# Patient Record
Sex: Male | Born: 1995 | Race: White | Hispanic: No | Marital: Single | Smoking: Current every day smoker
Health system: Southern US, Community
[De-identification: ages and names within clinical notes are randomized; demographics above are authoritative.]

## PROBLEM LIST (undated history)

## (undated) DIAGNOSIS — I319 Disease of pericardium, unspecified: Secondary | ICD-10-CM

## (undated) DIAGNOSIS — I514 Myocarditis, unspecified: Secondary | ICD-10-CM

---

## 2017-02-19 ENCOUNTER — Encounter (HOSPITAL_COMMUNITY): Payer: Self-pay

## 2017-02-19 ENCOUNTER — Other Ambulatory Visit: Payer: Self-pay

## 2017-02-19 ENCOUNTER — Emergency Department (HOSPITAL_COMMUNITY): Payer: BLUE CROSS/BLUE SHIELD

## 2017-02-19 ENCOUNTER — Inpatient Hospital Stay (HOSPITAL_COMMUNITY)
Admission: EM | Disposition: A | Discharge status: Home / Self Care | Payer: Self-pay | Source: Home / Self Care | Attending: Internal Medicine

## 2017-02-19 ENCOUNTER — Emergency Department (HOSPITAL_BASED_OUTPATIENT_CLINIC_OR_DEPARTMENT_OTHER): Payer: BLUE CROSS/BLUE SHIELD

## 2017-02-19 ENCOUNTER — Inpatient Hospital Stay (HOSPITAL_COMMUNITY)
Admission: EM | Admit: 2017-02-19 | Discharge: 2017-02-21 | DRG: 287 | Disposition: A | Discharge status: Home / Self Care | Payer: BLUE CROSS/BLUE SHIELD | Attending: Internal Medicine | Admitting: Internal Medicine

## 2017-02-19 DIAGNOSIS — R001 Bradycardia, unspecified: Secondary | ICD-10-CM | POA: Diagnosis not present

## 2017-02-19 DIAGNOSIS — I514 Myocarditis, unspecified: Secondary | ICD-10-CM | POA: Insufficient documentation

## 2017-02-19 DIAGNOSIS — I340 Nonrheumatic mitral (valve) insufficiency: Secondary | ICD-10-CM

## 2017-02-19 DIAGNOSIS — R739 Hyperglycemia, unspecified: Secondary | ICD-10-CM | POA: Diagnosis present

## 2017-02-19 DIAGNOSIS — R748 Abnormal levels of other serum enzymes: Secondary | ICD-10-CM

## 2017-02-19 DIAGNOSIS — R072 Precordial pain: Secondary | ICD-10-CM

## 2017-02-19 DIAGNOSIS — R7989 Other specified abnormal findings of blood chemistry: Secondary | ICD-10-CM

## 2017-02-19 DIAGNOSIS — I409 Acute myocarditis, unspecified: Secondary | ICD-10-CM | POA: Diagnosis not present

## 2017-02-19 DIAGNOSIS — I319 Disease of pericardium, unspecified: Secondary | ICD-10-CM

## 2017-02-19 DIAGNOSIS — R778 Other specified abnormalities of plasma proteins: Secondary | ICD-10-CM

## 2017-02-19 DIAGNOSIS — Z823 Family history of stroke: Secondary | ICD-10-CM

## 2017-02-19 DIAGNOSIS — F17210 Nicotine dependence, cigarettes, uncomplicated: Secondary | ICD-10-CM | POA: Diagnosis present

## 2017-02-19 DIAGNOSIS — Z8249 Family history of ischemic heart disease and other diseases of the circulatory system: Secondary | ICD-10-CM

## 2017-02-19 HISTORY — PX: LEFT HEART CATH AND CORONARY ANGIOGRAPHY: CATH118249

## 2017-02-19 HISTORY — DX: Disease of pericardium, unspecified: I31.9

## 2017-02-19 HISTORY — DX: Myocarditis, unspecified: I51.4

## 2017-02-19 LAB — HEPATIC FUNCTION PANEL
ALBUMIN: 4.1 g/dL (ref 3.5–5.0)
ALK PHOS: 60 U/L (ref 38–126)
ALT: 37 U/L (ref 17–63)
AST: 34 U/L (ref 15–41)
BILIRUBIN TOTAL: 1.1 mg/dL (ref 0.3–1.2)
Bilirubin, Direct: <0.1 mg/dL — ABNORMAL LOW (ref 0.1–0.5)
Total Protein: 7.5 g/dL (ref 6.5–8.1)

## 2017-02-19 LAB — BASIC METABOLIC PANEL
Anion gap: 7 (ref 5–15)
BUN: 12 mg/dL (ref 6–20)
CALCIUM: 9.2 mg/dL (ref 8.9–10.3)
CO2: 23 mmol/L (ref 22–32)
CREATININE: 0.86 mg/dL (ref 0.61–1.24)
Chloride: 104 mmol/L (ref 101–111)
GFR calc Af Amer: >60 mL/min (ref >60)
GLUCOSE: 124 mg/dL — AB (ref 65–99)
Potassium: 3.9 mmol/L (ref 3.5–5.1)
Sodium: 134 mmol/L — ABNORMAL LOW (ref 135–145)

## 2017-02-19 LAB — CBC
HCT: 46.3 % (ref 39.0–52.0)
Hemoglobin: 16.4 g/dL (ref 13.0–17.0)
MCH: 31.1 pg (ref 26.0–34.0)
MCHC: 35.4 g/dL (ref 30.0–36.0)
MCV: 87.7 fL (ref 78.0–100.0)
Platelets: 280 10*3/uL (ref 150–400)
RBC: 5.28 MIL/uL (ref 4.22–5.81)
RDW: 13.5 % (ref 11.5–15.5)
WBC: 15 10*3/uL — ABNORMAL HIGH (ref 4.0–10.5)

## 2017-02-19 LAB — PROTIME-INR
INR: 1.05
PROTHROMBIN TIME: 13.6 s (ref 11.4–15.2)

## 2017-02-19 LAB — LIPID PANEL
CHOLESTEROL: 150 mg/dL (ref 0–200)
HDL: 36 mg/dL — ABNORMAL LOW (ref >40)
LDL Cholesterol: 94 mg/dL (ref 0–99)
Total CHOL/HDL Ratio: 4.2 RATIO
Triglycerides: 100 mg/dL (ref <150)
VLDL: 20 mg/dL (ref 0–40)

## 2017-02-19 LAB — I-STAT TROPONIN, ED: TROPONIN I, POC: 2.01 ng/mL — AB (ref 0.00–0.08)

## 2017-02-19 LAB — C-REACTIVE PROTEIN: CRP: 5.1 mg/dL — ABNORMAL HIGH (ref <1.0)

## 2017-02-19 LAB — SEDIMENTATION RATE: SED RATE: 8 mm/h (ref 0–16)

## 2017-02-19 LAB — ECHOCARDIOGRAM COMPLETE

## 2017-02-19 LAB — TROPONIN I
TROPONIN I: 9.73 ng/mL — AB (ref <0.03)
Troponin I: 2.38 ng/mL (ref <0.03)

## 2017-02-19 LAB — HEMOGLOBIN A1C
Hgb A1c MFr Bld: 4.9 % (ref 4.8–5.6)
Mean Plasma Glucose: 93.93 mg/dL

## 2017-02-19 LAB — TSH: TSH: 1.445 u[IU]/mL (ref 0.350–4.500)

## 2017-02-19 SURGERY — LEFT HEART CATH AND CORONARY ANGIOGRAPHY
Anesthesia: LOCAL

## 2017-02-19 MED ORDER — KETOROLAC TROMETHAMINE 30 MG/ML IJ SOLN
30.0000 mg | Freq: Once | INTRAMUSCULAR | Status: AC
  Administered 2017-02-19: 30 mg via INTRAVENOUS
  Filled 2017-02-19: qty 1

## 2017-02-19 MED ORDER — COLCHICINE 0.6 MG PO TABS
0.6000 mg | ORAL_TABLET | Freq: Two times a day (BID) | ORAL | Status: DC
  Administered 2017-02-19 – 2017-02-21 (×5): 0.6 mg via ORAL
  Filled 2017-02-19 (×5): qty 1

## 2017-02-19 MED ORDER — SODIUM CHLORIDE 0.9 % IV SOLN
INTRAVENOUS | Status: DC
  Administered 2017-02-19: 150 mL via INTRAVENOUS
  Administered 2017-02-19: 09:00:00 via INTRAVENOUS

## 2017-02-19 MED ORDER — VERAPAMIL HCL 2.5 MG/ML IV SOLN
INTRAVENOUS | Status: DC | PRN
  Administered 2017-02-19: 10 mL via INTRA_ARTERIAL

## 2017-02-19 MED ORDER — FENTANYL CITRATE (PF) 100 MCG/2ML IJ SOLN
INTRAMUSCULAR | Status: DC | PRN
  Administered 2017-02-19 (×2): 25 ug via INTRAVENOUS

## 2017-02-19 MED ORDER — ONDANSETRON HCL 4 MG/2ML IJ SOLN: 4.0000 mg | Freq: Four times a day (QID) | INTRAMUSCULAR | Status: DC | PRN

## 2017-02-19 MED ORDER — HEPARIN (PORCINE) IN NACL 2-0.9 UNIT/ML-% IJ SOLN
INTRAMUSCULAR | Status: AC
  Filled 2017-02-19: qty 1000

## 2017-02-19 MED ORDER — IBUPROFEN 600 MG PO TABS
600.0000 mg | ORAL_TABLET | Freq: Four times a day (QID) | ORAL | Status: DC
  Administered 2017-02-19 – 2017-02-20 (×3): 600 mg via ORAL
  Filled 2017-02-19 (×3): qty 1

## 2017-02-19 MED ORDER — VERAPAMIL HCL 2.5 MG/ML IV SOLN
INTRAVENOUS | Status: AC
  Filled 2017-02-19: qty 2

## 2017-02-19 MED ORDER — LIDOCAINE HCL (PF) 1 % IJ SOLN
INTRAMUSCULAR | Status: AC
  Filled 2017-02-19: qty 30

## 2017-02-19 MED ORDER — SODIUM CHLORIDE 0.9% FLUSH: 3.0000 mL | INTRAVENOUS | Status: DC | PRN

## 2017-02-19 MED ORDER — FENTANYL CITRATE (PF) 100 MCG/2ML IJ SOLN
INTRAMUSCULAR | Status: AC
  Filled 2017-02-19: qty 2

## 2017-02-19 MED ORDER — SODIUM CHLORIDE 0.9 % IV SOLN: 250.0000 mL | INTRAVENOUS | Status: DC | PRN

## 2017-02-19 MED ORDER — MIDAZOLAM HCL 2 MG/2ML IJ SOLN
INTRAMUSCULAR | Status: AC
  Filled 2017-02-19: qty 2

## 2017-02-19 MED ORDER — IOPAMIDOL (ISOVUE-370) INJECTION 76%
INTRAVENOUS | Status: AC
  Filled 2017-02-19: qty 100

## 2017-02-19 MED ORDER — MORPHINE SULFATE (PF) 4 MG/ML IV SOLN
4.0000 mg | Freq: Once | INTRAVENOUS | Status: AC
  Administered 2017-02-19: 4 mg via INTRAVENOUS
  Filled 2017-02-19: qty 1

## 2017-02-19 MED ORDER — SODIUM CHLORIDE 0.9% FLUSH
3.0000 mL | Freq: Two times a day (BID) | INTRAVENOUS | Status: DC
  Administered 2017-02-21 (×2): 3 mL via INTRAVENOUS

## 2017-02-19 MED ORDER — HEPARIN SODIUM (PORCINE) 1000 UNIT/ML IJ SOLN
INTRAMUSCULAR | Status: DC | PRN
  Administered 2017-02-19: 4500 [IU] via INTRAVENOUS

## 2017-02-19 MED ORDER — IOPAMIDOL (ISOVUE-370) INJECTION 76%
INTRAVENOUS | Status: DC | PRN
  Administered 2017-02-19: 85 mL via INTRA_ARTERIAL

## 2017-02-19 MED ORDER — NICOTINE 14 MG/24HR TD PT24
14.0000 mg | MEDICATED_PATCH | Freq: Every day | TRANSDERMAL | Status: DC
  Administered 2017-02-19 – 2017-02-21 (×3): 14 mg via TRANSDERMAL
  Filled 2017-02-19 (×4): qty 1

## 2017-02-19 MED ORDER — ACETAMINOPHEN 325 MG PO TABS: 650.0000 mg | ORAL_TABLET | ORAL | Status: DC | PRN

## 2017-02-19 MED ORDER — SODIUM CHLORIDE 0.9 % IV SOLN
INTRAVENOUS | Status: AC
  Administered 2017-02-19: 14:00:00 via INTRAVENOUS

## 2017-02-19 MED ORDER — MIDAZOLAM HCL 2 MG/2ML IJ SOLN
INTRAMUSCULAR | Status: DC | PRN
  Administered 2017-02-19 (×2): 1 mg via INTRAVENOUS

## 2017-02-19 MED ORDER — ASPIRIN 81 MG PO CHEW
324.0000 mg | CHEWABLE_TABLET | Freq: Once | ORAL | Status: AC
  Administered 2017-02-19: 324 mg via ORAL
  Filled 2017-02-19: qty 4

## 2017-02-19 MED ORDER — SODIUM CHLORIDE 0.9% FLUSH
3.0000 mL | Freq: Two times a day (BID) | INTRAVENOUS | Status: DC
  Administered 2017-02-19 – 2017-02-20 (×3): 3 mL via INTRAVENOUS

## 2017-02-19 MED ORDER — LIDOCAINE HCL (PF) 1 % IJ SOLN
INTRAMUSCULAR | Status: DC | PRN
  Administered 2017-02-19: 2 mL via INTRADERMAL

## 2017-02-19 MED ORDER — PANTOPRAZOLE SODIUM 40 MG PO TBEC
40.0000 mg | DELAYED_RELEASE_TABLET | Freq: Every day | ORAL | Status: DC
  Administered 2017-02-19 – 2017-02-21 (×3): 40 mg via ORAL
  Filled 2017-02-19 (×3): qty 1

## 2017-02-19 MED ORDER — HEPARIN SODIUM (PORCINE) 1000 UNIT/ML IJ SOLN
INTRAMUSCULAR | Status: AC
  Filled 2017-02-19: qty 1

## 2017-02-19 MED ORDER — DIAZEPAM 5 MG PO TABS
5.0000 mg | ORAL_TABLET | ORAL | Status: DC | PRN
  Administered 2017-02-20: 5 mg via ORAL
  Filled 2017-02-19: qty 1

## 2017-02-19 SURGICAL SUPPLY — 14 items
CATH INFINITI 5 FR JL3.5 (CATHETERS) ×2 IMPLANT
CATH INFINITI 5FR ANG PIGTAIL (CATHETERS) ×2 IMPLANT
CATH LAUNCHER 5F RADL (CATHETERS) ×1 IMPLANT
CATH OPTITORQUE TIG 4.0 5F (CATHETERS) ×2 IMPLANT
CATHETER LAUNCHER 5F RADL (CATHETERS) ×2
DEVICE RAD COMP TR BAND LRG (VASCULAR PRODUCTS) ×2 IMPLANT
GLIDESHEATH SLEND SS 6F .021 (SHEATH) ×2 IMPLANT
GUIDEWIRE INQWIRE 1.5J.035X260 (WIRE) ×1 IMPLANT
INQWIRE 1.5J .035X260CM (WIRE) ×2
KIT HEART LEFT (KITS) ×2 IMPLANT
PACK CARDIAC CATHETERIZATION (CUSTOM PROCEDURE TRAY) ×2 IMPLANT
SYR MEDRAD MARK V 150ML (SYRINGE) ×2 IMPLANT
TRANSDUCER W/STOPCOCK (MISCELLANEOUS) ×2 IMPLANT
TUBING CIL FLEX 10 FLL-RA (TUBING) ×2 IMPLANT

## 2017-02-19 NOTE — Progress Notes (Signed)
Per d/w Dr Tresa EndoKelly, initiate ibuprofen, colchicine for pericarditis (will add prophylactic ppi as well). Dayna Dunn PA-C

## 2017-02-19 NOTE — Progress Notes (Signed)
Pt. With critical troponin of 9.73. On call for cardiology paged to make aware.

## 2017-02-19 NOTE — ED Provider Notes (Addendum)
[facility]-EMERGENCY DEPT Provider Note   CSN: [id number] Arrival date & time: 02/19/17  0603     History   Chief Complaint Chief Complaint  Patient presents with  . Chest Pain    HPI Patrick Costa is a 22 y.o. male.  HPI Patient is a 22 year old male presents the emergency department going stabbing and sharp chest discomfort for the past 36 hours.  His pain began Monday evening.  His pain has been constant but at times seems to significantly worsen.  He has had several episodes where he has become diaphoretic.  Reports exertion seems to worsen his symptoms.  He cannot find a position of comfort.  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  Strong family history of cardiac disease in his grandfather.  His mother is 45 is had no heart issues at this point.  No heart issues on his father's side.  He does smoke cigarettes.  No known medical problems.  No unilateral leg swelling.  No history of DVT or pulmonary embolism.  Symptoms are persistent and moderate in severity.      History reviewed. No pertinent past medical history.  There are no active problems to display for this patient.   History reviewed. No pertinent surgical history.     Home Medications    Prior to Admission medications   Not on File    Family History History reviewed. No pertinent family history.  Social History Social History   Tobacco Use  . Smoking status: Never Smoker  . Smokeless tobacco: Never Used  Substance Use Topics  . Alcohol use: No    Frequency: Never  . Drug use: No     Allergies   Patient has no known allergies.   Review of Systems Review of Systems  All other systems reviewed and are negative.    Physical Exam Updated Vital Signs BP 115/75 (BP Location: Left Arm)   Pulse (!) 56   Temp (!) 97.5 F (36.4 C) (Oral)   Resp 15   SpO2 100%   Physical Exam  Constitutional: He is oriented to person, place, and time. He appears  well-developed and well-nourished.  HENT:  Head: Normocephalic and atraumatic.  Eyes: EOM are normal.  Neck: Normal range of motion.  Cardiovascular: Normal rate, regular rhythm, normal heart sounds and intact distal pulses.  Pulmonary/Chest: Effort normal and breath sounds normal. No respiratory distress.  Abdominal: Soft. He exhibits no distension. There is no tenderness.  Musculoskeletal: Normal range of motion.  Neurological: He is alert and oriented to person, place, and time.  Skin: Skin is warm and dry.  Psychiatric: He has a normal mood and affect. Judgment normal.  Nursing note and vitals reviewed.    ED Treatments / Results  Labs (all labs ordered are listed, but only abnormal results are displayed) Labs Reviewed  BASIC METABOLIC PANEL - Abnormal; Notable for the following components:      Result Value   Sodium 134 (*)    Glucose, Bld 124 (*)    All other components within normal limits  CBC - Abnormal; Notable for the following components:   WBC 15.0 (*)    All other components within normal limits  HEPATIC FUNCTION PANEL - Abnormal; Notable for the following components:   Bilirubin, Direct <0.1 (*)    All other components within normal limits  LIPID PANEL - Abnormal; Notable for the following components:   HDL 36 (*)    All other components  within normal limits  TROPONIN I - Abnormal; Notable for the following components:   Troponin I 2.38 (*)    All other components within normal limits  I-STAT TROPONIN, ED - Abnormal; Notable for the following components:   Troponin i, poc 2.01 (*)    All other components within normal limits  RAPID URINE DRUG SCREEN, HOSP PERFORMED    EKG  EKG Interpretation  Date/Time:  Wednesday February 19 2017 06:31:06 EST Ventricular Rate:  61 PR Interval:    QRS Duration: 80 QT Interval:  351 QTC Calculation: 354 R Axis:   63 Text Interpretation:  Sinus rhythm Borderline short PR interval ST elevation suggests acute  pericarditis Confirmed by Geoffery LyonseLo, Douglas ([id number]) on 02/19/2017 6:41:55 AM Also confirmed by Geoffery LyonseLo, Douglas ([id number]), editor Barbette Hairassel, Kerry [phone])  on 02/19/2017 7:48:16 AM       Radiology Dg Chest 2 View  Result Date: 02/19/2017 CLINICAL DATA:  Chest pain 3 days with onset of shortness of breath today. Smoker. EXAM: CHEST  2 VIEW COMPARISON:  None. FINDINGS: Lungs are adequately inflated and otherwise clear. Cardiomediastinal silhouette, bones and soft tissues are within normal. IMPRESSION: No active cardiopulmonary disease. Electronically Signed   By: Elberta Fortisaniel  Boyle M.D.   On: 02/19/2017 08:25    Procedures .Critical Care Performed by: Azalia Bilisampos, Lavaeh Bau, MD Authorized by: Azalia Bilisampos, Sanna Porcaro, MD     CRITICAL CARE Performed by: Azalia BilisKevin Harmon Bommarito Total critical care time: 32 minutes Critical care time was exclusive of separately billable procedures and treating other patients. Critical care was necessary to treat or prevent imminent or life-threatening deterioration. Critical care was time spent personally by me on the following activities: development of treatment plan with patient and/or surrogate as well as nursing, discussions with consultants, evaluation of patient's response to treatment, examination of patient, obtaining history from patient or surrogate, ordering and performing treatments and interventions, ordering and review of laboratory studies, ordering and review of radiographic studies, pulse oximetry and re-evaluation of patient's condition. Arranging transportation for urgent heart cath   Medications Ordered in ED Medications  aspirin chewable tablet 324 mg (324 mg Oral Given 02/19/17 0739)  ketorolac (TORADOL) 30 MG/ML injection 30 mg (30 mg Intravenous Given 02/19/17 0739)  morphine 4 MG/ML injection 4 mg (4 mg Intravenous Given 02/19/17 0739)     Initial Impression / Assessment and Plan / ED Course  I have reviewed the triage vital signs and the nursing notes.  Pertinent labs & imaging  results that were available during my care of the patient were reviewed by me and considered in my medical decision making (see chart for details).    Elevated troponin.  Symptoms concerning for pericarditis with diffuse ST elevation noted on his EKG.  Possible mild PR depression.  He does have some components of his chest pain which are concerning such as the diaphoresis and the exertional component.  Patient will benefit from cardiology consultation.  I have ordered a bedside echocardiogram.  Aspirin now.  IV Toradol for symptom control.  Chest x-ray pending.  Will obtain a repeat lab troponin is initial elevated troponin is a point-of-care.   8:31 AM Case discussed with cardiology who will see the patient in the ER. Echo being perfomed at the bedside now.   9:05 AM Pt seen and evaluated by cardiology who will send the patient for urgent heart cath at this time. Please see consultation note for complete details  Final Clinical Impressions(s) / ED Diagnoses   Final diagnoses:  None  ED Discharge Orders    None       Azalia Bilis, MD 02/19/17 1610    Azalia Bilis, MD 02/19/17 [phone]

## 2017-02-19 NOTE — ED Notes (Signed)
Pt has been placed in a gown and has also been placed on the heart monitor, RN is at the bedside starting an IV at this time.

## 2017-02-19 NOTE — ED Notes (Signed)
Bed: WA04 Expected date:  Expected time:  Means of arrival:  Comments: 

## 2017-02-19 NOTE — Consult Note (Deleted)
Wrong note type chosen, see H/P.

## 2017-02-19 NOTE — Progress Notes (Signed)
Pt denies c/o chest pain , r hand neurovascular status intact. Awaiting bed assignment.

## 2017-02-19 NOTE — Progress Notes (Signed)
  Echocardiogram 2D Echocardiogram has been performed.  Janalyn HarderWest, Adine Heimann R 02/19/2017, 8:54 AM

## 2017-02-19 NOTE — Progress Notes (Signed)
Removal of Right radial TR band. Site stable with no hematoma,, oozing or bruising present. Sterile 2x2 gauze to insertion site with sterile tegaderm to secure. Pateint given post recovery instructions and able to repeat process. Allens Test positive accessed.

## 2017-02-19 NOTE — ED Notes (Signed)
Pt complains of chest pain in his left chest, he states that it's pressure, stabbing and sharp. Pt is sweaty in triage and states that he wakes up in a cold sweat

## 2017-02-19 NOTE — Progress Notes (Addendum)
Received pt alert and oriented X4. Skin warm and dry.  Pt denies any CP at this time.  IVF infusing, pt on monitor and consent signed.  Pt to procedure area via stretcher.

## 2017-02-19 NOTE — Progress Notes (Signed)
ANTICOAGULATION CONSULT NOTE - Initial Consult  Pharmacy Consult for heparin Indication: chest pain/ACS  No Known Allergies  Patient Measurements:   Heparin Dosing Weight:  Vital Signs: Temp: 97.5 F (36.4 C) (01/16 0627) Temp Source: Oral (01/16 0627) BP: 115/75 (01/16 0717) Pulse Rate: 56 (01/16 0717)  Labs: Recent Labs    02/19/17 0650  HGB 16.4  HCT 46.3  PLT 280  CREATININE 0.86  TROPONINI 2.38*    CrCl cannot be calculated (Unknown ideal weight.).   Medical History: History reviewed. No pertinent past medical history.  Medications:  No meds PTA  Assessment: 22 yo with CP and + enzymes to start heparin per pharmacy for ACS.  Trop 2.38. Cr 0.86, CBC WNL.   Goal of Therapy:  Heparin level 0.3-0.7 units/ml Monitor platelets by anticoagulation protocol: Yes   Plan:  [facility] No height and weight entered into Epic yet Herby AbrahamMichelle T. Ercilia Bettinger, Pharm.D. [phone] 02/19/2017 9:57 AM

## 2017-02-19 NOTE — Interval H&P Note (Signed)
Cath Lab Visit (complete for each Cath Lab visit)  Clinical Evaluation Leading to the Procedure:   ACS: No.  Non-ACS:    Anginal Classification: CCS III  Anti-ischemic medical therapy: No Therapy  Non-Invasive Test Results: No non-invasive testing performed  Prior CABG: No previous CABG      History and Physical Interval Note:  02/19/2017 10:59 AM  Patrick Costa  has presented today for surgery, with the diagnosis of ongoing cp  The various methods of treatment have been discussed with the patient and family. After consideration of risks, benefits and other options for treatment, the patient has consented to  Procedure(s): LEFT HEART CATH AND CORONARY ANGIOGRAPHY (N/A) as a surgical intervention .  The patient's history has been reviewed, patient examined, no change in status, stable for surgery.  I have reviewed the patient's chart and labs.  Questions were answered to the patient's satisfaction.     Nicki Guadalajarahomas Kelly

## 2017-02-19 NOTE — ED Notes (Signed)
[facility] leaving with patient ° °

## 2017-02-19 NOTE — Progress Notes (Signed)
Wrote care order earlier this afternoon for nurse to call lab to make sure the labs that were ordered earlier get drawn as they still said they needed to be collected. When I spoke with her earlier she states she had contacted them and they were aware of this need and would take care of it. Labs are still not pending. Have spoken with 3E charge nurse asking them to inform lab and make sure these get drawn. Kimber Esterly PA-C

## 2017-02-19 NOTE — ED Notes (Signed)
Signatures obtained for transfer and consent for catheterization.

## 2017-02-19 NOTE — H&P (Addendum)
Cardiology Consultation:   Patient ID: Stavros Cail; [id number]; Dec 07, 1995   Admit date: 02/19/2017 Date of Consult: 02/19/2017  Primary Care Provider: No primary care provider on file.  Chief Complaint: Chest pain  Patient Profile:   Jobanny Mavis is a 22 y.o. male with a history of tobacco use but otherwise no significant PMH who is being seen today for the evaluation of chest pain at the request of Dr Patria Mane.  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 On Monday night, he had some mid-chest pain that he thought may be gas pains, but then had sweats and chills throughout the night. He did not check a temperature. He worked all day Tuesday and did not notice pains except in the morning and then after work when he "slowed down". It is located on the left side, radiates to his back, lasts for 30-40 minutes, worse with inspiration, and worse with lying down. He has had some nausea this morning with the pain. No sick contacts. He has not been sick recently. No drug use. Curre      nt smoker of 1 ppd. No recent travel, except the 4 hour drive from [facility]. No swelling in his legs. SOB during the CP. Family history significant for maternal grandfather with CAD with PCI and CVA, but pt believes first cardiac event was at 22 years old. No new tattoos. He does not report any abnormal activity over the weekend. He works out every other day. He presented to the ER this morning with worsening CP. He was treated with 324 mg ASA, 4 mg morphine, and IV toradol with transient relief. But now, he continues to have intermittent left-sided CP.  Pertinent admission las include: Troponin 2.38 and leukocytosis with WBC 15.0. CXR shows no active cardiopulmonary disease (personally reviewed with normal heart size). Echo read is pending.  History reviewed. No pertinent past medical history.  History reviewed. No pertinent surgical  history.   Inpatient Medications: Scheduled Meds:  Continuous Infusions:  PRN Meds:   Home Meds: Prior to Admission medications   Not on File    Allergies:   No Known Allergies  Social History:   Social History   Socioeconomic History  . Marital status: Single    Spouse name: Not on file  . Number of children: Not on file  . Years of education: Not on file  . Highest education level: Not on file  Social Needs  . Financial resource strain: Not on file  . Food insecurity - worry: Not on file  . Food insecurity - inability: Not on file  . Transportation needs - medical: Not on file  . Transportation needs - non-medical: Not on file  Occupational History  . Not on file  Tobacco Use  . Smoking status: Current Every Day Smoker    Packs/day: 1.00    Years: 4.00    Pack years: 4.00  . Smokeless tobacco: Never Used  Substance and Sexual Activity  . Alcohol use: Yes    Frequency: Never    Comment: drinks once per week  . Drug use: No  . Sexual activity: Not on file  Other Topics Concern  . Not on file  Social History Narrative  . Not on file    Family History:   The patient's family history includes CAD (age of onset: 89) in his maternal grandfather; CVA in his maternal grandfather.  ROS:  Please see  the history of present illness.  All other ROS reviewed and negative.     Physical Exam/Data:   Vitals:   02/19/17 0627 02/19/17 0717 02/19/17 0918  BP: 108/66 115/75 125/69  Pulse: 60 (!) 56 66  Resp: (!) 23 15 (!) 24  Temp: (!) 97.5 F (36.4 C)    TempSrc: Oral    SpO2: 100% 100% 97%   No intake or output data in the 24 hours ending 02/19/17 0926 There were no vitals filed for this visit. There is no height or weight on file to calculate BMI.  General: Well developed, well nourished, in no acute distress. Head: Normocephalic, atraumatic, sclera non-icteric, no xanthomas, nares are without discharge.  Neck: Negative for carotid bruits. JVD not  elevated. Lungs: Clear bilaterally to auscultation without wheezes, rales, or rhonchi. Breathing is unlabored. Heart: RRR with S1 S2. No murmurs, rubs, or gallops appreciated.  Abdomen: Soft, non-tender, non-distended with normoactive bowel sounds. No hepatomegaly. No rebound/guarding. No obvious abdominal masses. Msk:  Strength and tone appear normal for age. Extremities: No clubbing or cyanosis. No edema.  Distal pedal pulses are 2+ and equal bilaterally. Neuro: Alert and oriented X 3. No facial asymmetry. No focal deficit. Moves all extremities spontaneously. Psych:  Responds to questions appropriately with a normal affect. Very polite, calm.  EKG:  The EKG was personally reviewed and demonstrates diffuse ST elevation in leads I, II, avF, and V2-V6  Relevant CV Studies: Echo pending.  Laboratory Data:  Chemistry Recent Labs  Lab 02/19/17 0650  NA 134*  K 3.9  CL 104  CO2 23  GLUCOSE 124*  BUN 12  CREATININE 0.86  CALCIUM 9.2  GFRNONAA >60  GFRAA >60  ANIONGAP 7    Recent Labs  Lab 02/19/17 0650  PROT 7.5  ALBUMIN 4.1  AST 34  ALT 37  ALKPHOS 60  BILITOT 1.1   Hematology Recent Labs  Lab 02/19/17 0650  WBC 15.0*  RBC 5.28  HGB 16.4  HCT 46.3  MCV 87.7  MCH 31.1  MCHC 35.4  RDW 13.5  PLT 280   Cardiac Enzymes Recent Labs  Lab 02/19/17 0650  TROPONINI 2.38*    Recent Labs  Lab 02/19/17 0656  TROPIPOC 2.01*    BNPNo results for input(s): BNP, PROBNP in the last 168 hours.  DDimer No results for input(s): DDIMER in the last 168 hours.  Radiology/Studies:  Dg Chest 2 View  Result Date: 02/19/2017 CLINICAL DATA:  Chest pain 3 days with onset of shortness of breath today. Smoker. EXAM: CHEST  2 VIEW COMPARISON:  None. FINDINGS: Lungs are adequately inflated and otherwise clear. Cardiomediastinal silhouette, bones and soft tissues are within normal. IMPRESSION: No active cardiopulmonary disease. Electronically Signed   By: Elberta Fortis M.D.   On:  02/19/2017 08:25    Assessment and Plan:   1. Chest pain with positive troponin - EKG with diffuse ST elevation, concerning for pericarditis. Troponin is more elevated (2.38) than expected for pericarditis, so may be more consistent with myopericarditis. WBC is also elevated to 15, but he is afebrile, although he has had sweats and chills. He does not have a history that would be concerning for PE (no recent travel, leg pain, or leg edema), although he does smoke. He is also not hypoxic or tachycardic. He denies drug use, but will check UDS just to make sure. CXR negative for cardiopulmonary disease. No enlargement that is concerning for pericardial effusion. He continues to have intermittent left-sided CP, worse  with inspiration and leaning forward. It is concerning that he continues to have CP and ST changes, so after discussion with Dr. 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 Dr. Eden EmmsNishan recommends we start heparin. Echo pending but per tech, did not appear to show any acute effusion. We will also check sed rate and CRP.  2. Tobacco use - smokes 1 ppd x 4 years. Encouraged cessation.   3. Leukocytosis - ? Reactive, due to stress margination. He has no focal infective symptoms. Would trend for now. No prior to compare to.   4. Mild hyperglycemia - would follow for now. Add A1C.   For questions or updates, please contact [facility] Please consult [email or web address] for contact info under Cardiology/STEMI.   Signed, Alford HighlandAshley M Smith, NP  02/19/2017 9:26 AM    Patient seen and examined and discussed with Lucile Craterana Dunn, PAC.  Agree with assessment and plan.  Mr. Otis PeakDillon Handy is a 22 year old male who has a family history for premature CAD.  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  He denies any recent fevers,  chills or night sweats.  His experience left-sided chest pain radiating to his back that seems to have a pleuritic component.  He has a history of tobacco use for 4 years, currently 1 pack per day.  His ECG has shown early ST segment elevation which may be secondary to pericardial involvement.  Troponin is elevated at 2.38.  Apparently Lucile Craterana Dunn, PA and Duwaine MaxinAshley Smith, NP had discussed the clinical scenario with Dr. Jacquiline DoeNishan Hu in light of the patient's ECG changes, chest pain, part an elevation, and physical work recommended definitive cardiac catheterization to exclude coronary obstructive disease.  Suspect the patient may have pericarditis or myocarditis contributed to his symptomatology.  Apparently, the patient was [facility] to undergo the cardiac catheterization procedure and Dr. Eden EmmsNishan was [facility] to [facility] the patient when he was [facility].  The patient is scheduled to undergo cardiac catheterization with me.  Agree with the physical examination findings as above.  Rezulin, the recent chest pain has improved.  HEENT is unremarkable.  There is no JVD.  There was no pulsus paradoxic's.  Lungs were clear.  Rhythm was regular.  I did not hear any pericardial friction rub.  Abdomen soft and nontender.  Pulses were 2+.  There was no clubbing cyanosis or edema.  He did have multiple tattoos.  Neurologic exam is grossly nonfocal.  The risks and benefits of a cardiac catheterization including, but not limited to, death, stroke, MI, kidney damage and bleeding were discussed with the patient who indicates understanding and agrees to proceed.  Plan definitive coronary angiography.   Lennette Biharihomas A. Petrea Fredenburg, MD, [facility] 02/19/2017 10:49 AM

## 2017-02-20 ENCOUNTER — Observation Stay (HOSPITAL_COMMUNITY): Payer: BLUE CROSS/BLUE SHIELD

## 2017-02-20 ENCOUNTER — Encounter (HOSPITAL_COMMUNITY): Payer: Self-pay | Admitting: Cardiovascular Disease

## 2017-02-20 DIAGNOSIS — R072 Precordial pain: Secondary | ICD-10-CM | POA: Diagnosis present

## 2017-02-20 DIAGNOSIS — Z823 Family history of stroke: Secondary | ICD-10-CM | POA: Diagnosis not present

## 2017-02-20 DIAGNOSIS — Z8249 Family history of ischemic heart disease and other diseases of the circulatory system: Secondary | ICD-10-CM | POA: Diagnosis not present

## 2017-02-20 DIAGNOSIS — B3323 Viral pericarditis: Secondary | ICD-10-CM

## 2017-02-20 DIAGNOSIS — F17210 Nicotine dependence, cigarettes, uncomplicated: Secondary | ICD-10-CM | POA: Diagnosis present

## 2017-02-20 DIAGNOSIS — R739 Hyperglycemia, unspecified: Secondary | ICD-10-CM | POA: Diagnosis present

## 2017-02-20 DIAGNOSIS — R001 Bradycardia, unspecified: Secondary | ICD-10-CM | POA: Diagnosis not present

## 2017-02-20 DIAGNOSIS — I409 Acute myocarditis, unspecified: Secondary | ICD-10-CM | POA: Diagnosis present

## 2017-02-20 LAB — TROPONIN I
TROPONIN I: 11.1 ng/mL — AB (ref <0.03)
Troponin I: 5.72 ng/mL (ref <0.03)
Troponin I: 8.26 ng/mL (ref <0.03)

## 2017-02-20 LAB — CBC
HCT: 43.3 % (ref 39.0–52.0)
Hemoglobin: 14.7 g/dL (ref 13.0–17.0)
MCH: 30.6 pg (ref 26.0–34.0)
MCHC: 33.9 g/dL (ref 30.0–36.0)
MCV: 90.2 fL (ref 78.0–100.0)
PLATELETS: 261 10*3/uL (ref 150–400)
RBC: 4.8 MIL/uL (ref 4.22–5.81)
RDW: 13.7 % (ref 11.5–15.5)
WBC: 6.8 10*3/uL (ref 4.0–10.5)

## 2017-02-20 LAB — BASIC METABOLIC PANEL
ANION GAP: 9 (ref 5–15)
BUN: 12 mg/dL (ref 6–20)
CALCIUM: 8.9 mg/dL (ref 8.9–10.3)
CO2: 23 mmol/L (ref 22–32)
Chloride: 106 mmol/L (ref 101–111)
Creatinine, Ser: 0.84 mg/dL (ref 0.61–1.24)
GFR calc Af Amer: >60 mL/min (ref >60)
GLUCOSE: 102 mg/dL — AB (ref 65–99)
POTASSIUM: 4.3 mmol/L (ref 3.5–5.1)
Sodium: 138 mmol/L (ref 135–145)

## 2017-02-20 LAB — HIV ANTIBODY (ROUTINE TESTING W REFLEX): HIV Screen 4th Generation wRfx: NONREACTIVE

## 2017-02-20 MED ORDER — HYDROMORPHONE HCL 1 MG/ML IJ SOLN
0.5000 mg | INTRAMUSCULAR | Status: DC | PRN
  Administered 2017-02-20: 0.5 mg via INTRAVENOUS
  Filled 2017-02-20: qty 0.5

## 2017-02-20 MED ORDER — IBUPROFEN 600 MG PO TABS
600.0000 mg | ORAL_TABLET | Freq: Four times a day (QID) | ORAL | Status: DC
  Administered 2017-02-20 – 2017-02-21 (×5): 600 mg via ORAL
  Filled 2017-02-20 (×5): qty 1

## 2017-02-20 MED ORDER — NITROGLYCERIN 0.4 MG SL SUBL
SUBLINGUAL_TABLET | SUBLINGUAL | Status: AC
  Administered 2017-02-20: 07:00:00
  Administered 2017-02-20: 0.4 mg
  Administered 2017-02-20: 07:00:00
  Filled 2017-02-20: qty 1

## 2017-02-20 MED ORDER — GADOBENATE DIMEGLUMINE 529 MG/ML IV SOLN
30.0000 mL | Freq: Once | INTRAVENOUS | Status: AC | PRN
  Administered 2017-02-20: 30 mL via INTRAVENOUS

## 2017-02-20 MED FILL — Heparin Sodium (Porcine) 2 Unit/ML in Sodium Chloride 0.9%: INTRAMUSCULAR | Qty: 1000 | Status: AC

## 2017-02-20 NOTE — Progress Notes (Signed)
Troponin 11.10 was 8.26, no CP, MD notified, will continue to monitor, Thanks Lavonda JumboMike F RN

## 2017-02-20 NOTE — Significant Event (Signed)
Rapid Response Event Note  Overview:  Called to assist with patient c/o chest pain Time Called: 0727 Event Type: Cardiac  Initial Focused Assessment: Called to assist with patient with CP unrelieved with NTG x 3.  Patient sitting up in bed - alert - oriented - warm and dry color good - resps regular and unlabored - denies nausea, diaphoresis and SOB - endorses worsening pain with deep breath - describes pain as sharp and burning with some pressure left lateral side - endorses same pain as yesterday - had cath yesterday with clean coronaries. Bil BS = clear. HS without murmur or rub noted.  Dx with pericarditis -myocarditis yesterday.    Interventions:  RN Clydie BraunKaren present in room - has given Ibuprofen at 0750 - 3 NTG SL given PTA RRT - no change in pain - BP 118/63 HR 62 on monitor SR - RR 20 O2 sats 100%.  12 lead done - no acute changes.  No pain meds ordered at this time. Most likely non-cardiac pain - RN Clydie BraunKaren paging Dr. Eden EmmsNishan who is on the floor - in to see patient.  Meds ordered - handoff to Clydie BraunKaren RN and Salome RN - to call as needed.   Plan of Care (if not transferred):  Evaluate results of pain meds - to call MD if no relief. Follow Up:  1030 - patient sitting up in bed - pain level at 0 -states he feels the best he has felt.  VSS.   Event Summary: Name of Physician Notified: Dr. Eden EmmsNishan at (at bedside)    at    Outcome: Stayed in room and stabalized  Event End Time: 0750  Delton PrairieBritt, Antonietta Lansdowne L

## 2017-02-20 NOTE — Progress Notes (Signed)
Pt. With critical Troponin value this am of 5.72. On call Cardiologist paged to make aware.

## 2017-02-20 NOTE — Progress Notes (Signed)
Progress Note  Patient Name: Patrick Costa Date of Encounter: 02/20/2017  Primary Cardiologist: No primary care provider on file.   Subjective   Atypical chest pain. Apparantly nursing tried to call fellow at 6:00 am and no response Patient supposed to be rounded on by Dr Tenny Crawoss I was on floor at time of page to me As DOD and saw patient immediately. He had cath yesterday with no CAD. Presumed Diagnosis of pericarditis/myocarditis  Inpatient Medications    Scheduled Meds: . colchicine  0.6 mg Oral BID  . ibuprofen  600 mg Oral Q6H  . nicotine  14 mg Transdermal Daily  . pantoprazole  40 mg Oral Daily  . sodium chloride flush  3 mL Intravenous Q12H  . sodium chloride flush  3 mL Intravenous Q12H   Continuous Infusions: . sodium chloride    . sodium chloride 150 mL (02/19/17 1117)  . sodium chloride     PRN Meds: sodium chloride, sodium chloride, acetaminophen, diazepam, HYDROmorphone (DILAUDID) injection, ondansetron (ZOFRAN) IV, sodium chloride flush, sodium chloride flush   Vital Signs    Vitals:   02/20/17 0639 02/20/17 0717 02/20/17 0722 02/20/17 0735  BP: 106/67 135/81 129/77 118/63  Pulse: 80 68 (!) 57 (!) 59  Resp: 18     Temp: 97.9 F (36.6 C)     TempSrc: Oral     SpO2: 98%     Weight: 203 lb 3.2 oz (92.2 kg)     Height:        Intake/Output Summary (Last 24 hours) at 02/20/2017 0757 Last data filed at 02/20/2017 0300 Gross per 24 hour  Intake 16340.95 ml  Output 100 ml  Net 16240.95 ml   Filed Weights   02/19/17 1437 02/20/17 0639  Weight: 201 lb (91.2 kg) 203 lb 3.2 oz (92.2 kg)    Telemetry    NSR - Personally Reviewed  ECG    NSR lateral T wave changes old  - Personally Reviewed  Physical Exam   GEN: No acute distress.   Neck: No JVD Cardiac: RRR, no murmurs, rubs, or gallops.  Respiratory: Clear to auscultation bilaterally. GI: Soft, nontender, non-distended  MS: No edema; No deformity. Neuro:  Nonfocal  Psych: Normal affect     Labs    Chemistry Recent Labs  Lab 02/19/17 0650 02/20/17 0309  NA 134* 138  K 3.9 4.3  CL 104 106  CO2 23 23  GLUCOSE 124* 102*  BUN 12 12  CREATININE 0.86 0.84  CALCIUM 9.2 8.9  PROT 7.5  --   ALBUMIN 4.1  --   AST 34  --   ALT 37  --   ALKPHOS 60  --   BILITOT 1.1  --   GFRNONAA >60 >60  GFRAA >60 >60  ANIONGAP 7 9     Hematology Recent Labs  Lab 02/19/17 0650 02/20/17 0309  WBC 15.0* 6.8  RBC 5.28 4.80  HGB 16.4 14.7  HCT 46.3 43.3  MCV 87.7 90.2  MCH 31.1 30.6  MCHC 35.4 33.9  RDW 13.5 13.7  PLT 280 261    Cardiac Enzymes Recent Labs  Lab 02/19/17 0650 02/19/17 2004 02/20/17 0309  TROPONINI 2.38* 9.73* 5.72*    Recent Labs  Lab 02/19/17 0656  TROPIPOC 2.01*     BNPNo results for input(s): BNP, PROBNP in the last 168 hours.   DDimer No results for input(s): DDIMER in the last 168 hours.   Radiology    Dg Chest 2 View  Result  Date: 02/19/2017 CLINICAL DATA:  Chest pain 3 days with onset of shortness of breath today. Smoker. EXAM: CHEST  2 VIEW COMPARISON:  None. FINDINGS: Lungs are adequately inflated and otherwise clear. Cardiomediastinal silhouette, bones and soft tissues are within normal. IMPRESSION: No active cardiopulmonary disease. Electronically Signed   By: Elberta Fortis M.D.   On: 02/19/2017 08:25    Cardiac Studies   Echo normal EF 50 - 55%  Cath no CAD   Patient Profile     22 y.o. male with chest pain. Echo EF 50-55% Cath with no CAD presumed myopericarditis  Assessment & Plan    1. Chest pain:  Given dilaudid this am continue colchicine and NSAI's will order cardiac  MRI to confirm diagnosis. Continue to trend ECG and troponins   For questions or updates, please contact [facility] Please consult [email or web address] for contact info under Cardiology/STEMI.      Signed, Charlton Haws, MD  02/20/2017, 7:57 AM

## 2017-02-20 NOTE — Plan of Care (Signed)
Pt. Ambulatory in room independently.

## 2017-02-20 NOTE — Progress Notes (Signed)
Events of hospitalization noted  Pt currently comfortable  On motrin/colchicine  MRI today.  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 Will need to establish in cardiology.  Dietrich PatesPaula Ross

## 2017-02-20 NOTE — Progress Notes (Signed)
Pt is requesting to be [facility] [place] when [place] is medically stable, Thanks Glenna FellowsMike F RN.

## 2017-02-20 NOTE — Progress Notes (Signed)
Pt. With c/o CP 8/10, crushing, aching, pressure, that radiated to back at change of shift this am. On call for Cardiology paged x 3 (Card Master, Nicholes Roughuke, Nishan). 02 applied for comfort, Nitro given x3. Chest pain unrelieved. RR RN called and at pts. Bedside. EKG obtained, VS completed and WNL. Eden EmmsNishan, MD at pts. Bedside to see pt. New orders received. On coming RN aware of event.

## 2017-02-21 DIAGNOSIS — B3322 Viral myocarditis: Secondary | ICD-10-CM

## 2017-02-21 LAB — RAPID URINE DRUG SCREEN, HOSP PERFORMED
Amphetamines: NOT DETECTED
Barbiturates: NOT DETECTED
Benzodiazepines: NOT DETECTED
Cocaine: NOT DETECTED
OPIATES: NOT DETECTED
Tetrahydrocannabinol: NOT DETECTED

## 2017-02-21 LAB — TROPONIN I: Troponin I: 4.39 ng/mL (ref <0.03)

## 2017-02-21 MED ORDER — IBUPROFEN 600 MG PO TABS: 600.0000 mg | ORAL_TABLET | Freq: Four times a day (QID) | ORAL | 0 refills | Status: DC

## 2017-02-21 MED ORDER — IBUPROFEN 600 MG PO TABS: 600.0000 mg | ORAL_TABLET | Freq: Four times a day (QID) | ORAL | 0 refills | Status: AC

## 2017-02-21 MED ORDER — COLCHICINE 0.6 MG PO TABS: 0.6000 mg | ORAL_TABLET | Freq: Two times a day (BID) | ORAL | 1 refills | Status: DC

## 2017-02-21 MED ORDER — NICOTINE 14 MG/24HR TD PT24: 14.0000 mg | MEDICATED_PATCH | Freq: Every day | TRANSDERMAL | 0 refills | Status: AC

## 2017-02-21 MED ORDER — PANTOPRAZOLE SODIUM 40 MG PO TBEC: 40.0000 mg | DELAYED_RELEASE_TABLET | Freq: Every day | ORAL | 1 refills | Status: AC

## 2017-02-21 NOTE — Discharge Summary (Signed)
Discharge Summary    Patient ID: Patrick Costa,  MRN: [id number], DOB/AGE: [birth year] 22 y.o.  Admit date: 02/19/2017 Discharge date: 02/21/2017  Primary Care Provider: Dr Josph Macho in [place] Primary Cardiologist: Dr Harrington Challenger  Discharge Diagnoses    Principal Problem:   Myocarditis [facility]) Active Problems:   Precordial chest pain   Pericarditis   Allergies No Known Allergies  Diagnostic Studies/Procedures    CARDIAC CATH: 02/19/2017  The left ventricular ejection fraction is 50-55% by visual estimate.  The left ventricular systolic function is normal.  LV end diastolic pressure is normal.  Low-normal ejection fraction at 50-55% without focal segmental wall motion abnormalities. Normal coronary arteries in a dominant RCA system. RECOMMENDATION: Medical therapy for presumed pericarditis versus myocarditis.  ECHO: 02/19/2017 - Left ventricle: The cavity size was normal. There was mild   concentric hypertrophy. Systolic function was normal. The   estimated ejection fraction was in the range of 50% to 55%. Wall   motion was normal; there were no regional wall motion   abnormalities. Left ventricular diastolic function parameters   were normal. - Mitral valve: There was mild regurgitation. - Right ventricle: Systolic function was normal. - Right atrium: The atrium was normal in size. - Pulmonary arteries: Systolic pressure could not be accurately   estimated. - Inferior vena cava: The vessel was normal in size. - Pericardium, extracardiac: There was no pericardial effusion. Impressions: - Mild diffuse hypokinesis, low normal LVEF 50-55%, no pericardial   effusion. 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 Results  above, no CAD. Presumed pericarditis. Pain control was difficult at first, was achieved with a combination of high-dose NSAIDs and colchicine. He had recurrent pain after he missed a dose of Ibuprofen, understands he needs to take this on schedule.   His troponin peak was 11.10. He remained pain-free on rx. With his elevation in troponin and normal ESR, an MRI was performed. It showed myocarditis, felt the cause of the enzyme elevation and chest pain.   He was noted to have sinus brady on telemetry, asymptomatic. His HR was dropping into the 30s at times when resting or asleep. He has not history of dizziness, presyncope or syncope. No PPM indicated.  On 01/18, he was seen by Dr Harrington Challenger and all data were reviewed. No further inpatient workup was indicated. He is willing to limit his activity at home. He will be out of strenuous work x 4 weeks. He is stable for discharge, to follow up as an outpatient.   _____________  Discharge Vitals Blood pressure (!) 107/55, pulse 78, temperature (!) 97.5 F (36.4 C), temperature source Oral, resp. rate 18, height _0  (1.803 m), weight 198 lb 3.2 oz (89.9 kg), SpO2 98 %.  Filed Weights   02/19/17 1437 02/20/17 0639 02/21/17 0530  Weight: 201 lb (91.2 kg) 203 lb 3.2 oz (92.2 kg) 198 lb 3.2 oz (89.9 kg)    Labs & Radiologic Studies    CBC Recent Labs    02/19/17 0650 02/20/17 0309  WBC 15.0* 6.8  HGB 16.4 14.7  HCT 46.3 43.3  MCV 87.7 90.2  PLT 280 109   Basic Metabolic Panel Recent Labs    02/19/17 0650 02/20/17 0309  NA 134* 138  K 3.9 4.3  CL 104 106  CO2 23 23  GLUCOSE 124* 102*  BUN 12 12  CREATININE 0.86 0.84  CALCIUM 9.2 8.9   Liver Function Tests Recent Labs    02/19/17 0650  AST 34  ALT 37  ALKPHOS 60  BILITOT 1.1  PROT 7.5  ALBUMIN 4.1    Cardiac Enzymes Recent Labs    02/19/17 0650 02/19/17 2004 02/20/17 0309 02/20/17 0857 02/20/17 1840 02/21/17 0913  TROPONINI 2.38* 9.73* 5.72* 8.26* 11.10* 4.39*    Hemoglobin A1C Recent Labs    02/19/17 2004  HGBA1C 4.9   Fasting Lipid Panel Recent Labs    02/19/17 0650  CHOL 150  HDL 36*  LDLCALC 94  TRIG 100  CHOLHDL 4.2   Thyroid Function Tests Recent Labs    02/19/17 2004  TSH 1.445   _____________  Dg Chest 2 View  Result Date: 02/19/2017 CLINICAL DATA:  Chest pain 3 days with onset of shortness of breath today. Smoker. EXAM: CHEST  2 VIEW COMPARISON:  None. FINDINGS: Lungs are adequately inflated and otherwise clear. Cardiomediastinal silhouette, bones and soft tissues are within normal. IMPRESSION: No active cardiopulmonary disease. Electronically Signed   By: Marin Olp M.D.   On: 02/19/2017 08:25   Mr Cardiac Morphology W Wo Contrast  Result Date: 02/20/2017 CLINICAL DATA:  Myocarditis EXAM: CARDIAC MRI TECHNIQUE: The patient was scanned on a 1.5 Tesla GE magnet. A dedicated cardiac coil was used. Functional imaging was done using Fiesta sequences. 2,3, and 4 chamber views were done to assess for RWMA's. Modified Simpson's rule using a short axis stack was used to calculate an ejection fraction on a dedicated work Conservation officer, nature. The patient received 30 cc of Multihance. After 10 minutes inversion recovery sequences were used to assess for infiltration and scar tissue. CONTRAST:  30 cc Multihance FINDINGS: All 4 chambers were normal in size and function. There was no pericardial effusion. There was no ASD/VSD. The aortic root was normal The quantitative EF was 61% (EDV 150 cc ESV 59 cc SV 91 cc) The mitral, aortic and tricuspid valve were normal. Delayed enhancement images showed significant sub epicardial uptake in the distal anterior wall, apex and inferior apex IMPRESSION: 1) Normal LV size and function EF 61% 2) Gadolinium uptake subepicardial in distal anterior wall, apex and inferior apex consistent with myocarditis 3) Normal RV size and function 4) No pericardial effusion 5) Normal cardiac valves 6) Normal  aortic root Jenkins Rouge Electronically Signed   By: Jenkins Rouge M.D.   On: 02/20/2017 17:38   Disposition   Pt is being discharged home today in good condition.  Follow-up Plans & Appointments     Discharge Instructions    Diet - low sodium heart healthy   Complete by:  As directed    Increase activity slowly   Complete by:  As directed       Discharge Medications   Allergies as of 02/21/2017   No Known Allergies     Medication List    TAKE these medications   colchicine 0.6 MG tablet Take 1 tablet (0.6 mg total) by mouth 2 (two) times daily.   ibuprofen 600 MG tablet Commonly known as:  ADVIL,MOTRIN Take 1 tablet (600 mg total) by mouth every 6 (six) hours.   nicotine 14 mg/24hr patch Commonly known as:  NICODERM CQ - dosed in mg/24 hours Place 1 patch (14 mg total) onto the skin daily. Then change  to 7 mg patch x 2 weeks and d/c Start taking on:  02/22/2017   pantoprazole 40 MG tablet Commonly known as:  PROTONIX Take 1 tablet (40 mg total) by mouth daily. Start taking on:  02/22/2017         Outstanding Labs/Studies   None  Duration of Discharge Encounter   Greater than 30 minutes including physician time.  Alcario Drought Barrett NP 02/21/2017, 12:25 PM

## 2017-02-21 NOTE — Progress Notes (Signed)
Patient and his father stated concerns this morning that they did not see a physician yesterday and only spoke to physician on-call during the night.  Dr. Tenny Crawoss in to see patient today and above information relayed to her.  She stated she and Dr. Eden EmmsNishan, both saw patient yesterday and she will follow up when she rounds today.

## 2017-02-21 NOTE — Plan of Care (Signed)
  Completed/Met Education: Knowledge of General Education information will improve 02/21/2017 1341 - Completed/Met by Alonna Buckler, RN Health Behavior/Discharge Planning: Ability to manage health-related needs will improve 02/21/2017 1341 - Completed/Met by Alonna Buckler, RN Clinical Measurements: Ability to maintain clinical measurements within normal limits will improve 02/21/2017 1341 - Completed/Met by Alonna Buckler, RN Will remain free from infection 02/21/2017 1341 - Completed/Met by Alonna Buckler, RN Diagnostic test results will improve 02/21/2017 1341 - Completed/Met by Alonna Buckler, RN Respiratory complications will improve 02/21/2017 1341 - Completed/Met by Alonna Buckler, RN Cardiovascular complication will be avoided 02/21/2017 1341 - Completed/Met by Alonna Buckler, RN Activity: Risk for activity intolerance will decrease 02/21/2017 1341 - Completed/Met by Alonna Buckler, RN Nutrition: Adequate nutrition will be maintained 02/21/2017 1341 - Completed/Met by Alonna Buckler, RN Coping: Level of anxiety will decrease 02/21/2017 1341 - Completed/Met by Alonna Buckler, RN Elimination: Will not experience complications related to bowel motility 02/21/2017 1341 - Completed/Met by Alonna Buckler, RN Will not experience complications related to urinary retention 02/21/2017 1341 - Completed/Met by Alonna Buckler, RN Pain Managment: General experience of comfort will improve 02/21/2017 1341 - Completed/Met by Alonna Buckler, RN Safety: Ability to remain free from injury will improve 02/21/2017 1341 - Completed/Met by Alonna Buckler, RN Skin Integrity: Risk for impaired skin integrity will decrease 02/21/2017 1341 - Completed/Met by Alonna Buckler, RN

## 2017-02-21 NOTE — Progress Notes (Signed)
Reviewed discharge instructions with patient and he stated understanding.  No voiced complaints.

## 2017-02-21 NOTE — Progress Notes (Addendum)
Progress Note  Patient Name: Patrick Costa Date of Encounter: 02/21/2017  Primary Cardiologist:  Dr Tenny Craw here, lives in [place]  Subjective   No CP or SOB. Aware meds are working. Wants d/c or to be transferred closer to home. Willing to rest at home, knows he will be out of work for several weeks.  Trying to get a desk job. Asks if he could return to work earlier if there was no lifting.  No sx from low HR, no hx presyncope or dizziness. Father has sinus brady as well.   Inpatient Medications    Scheduled Meds: . colchicine  0.6 mg Oral BID  . ibuprofen  600 mg Oral Q6H  . nicotine  14 mg Transdermal Daily  . pantoprazole  40 mg Oral Daily  . sodium chloride flush  3 mL Intravenous Q12H  . sodium chloride flush  3 mL Intravenous Q12H   Continuous Infusions: . sodium chloride    . sodium chloride 150 mL (02/19/17 1117)  . sodium chloride     PRN Meds: sodium chloride, sodium chloride, acetaminophen, diazepam, HYDROmorphone (DILAUDID) injection, ondansetron (ZOFRAN) IV, sodium chloride flush, sodium chloride flush   Vital Signs    Vitals:   02/20/17 1343 02/20/17 1924 02/21/17 0004 02/21/17 0530  BP: (!) 122/58 120/68 123/70 (!) 107/55  Pulse: (!) 55 65 (!) 57 78  Resp: 16 18 18 18   Temp: 98 F (36.7 C) 97.9 F (36.6 C) 97.9 F (36.6 C) (!) 97.5 F (36.4 C)  TempSrc: Oral Oral Oral Oral  SpO2: 98% 98% 100% 98%  Weight:    198 lb 3.2 oz (89.9 kg)  Height:        Intake/Output Summary (Last 24 hours) at 02/21/2017 0739 Last data filed at 02/21/2017 0600 Gross per 24 hour  Intake 320 ml  Output 421 ml  Net -101 ml   Filed Weights   02/19/17 1437 02/20/17 0639 02/21/17 0530  Weight: 201 lb (91.2 kg) 203 lb 3.2 oz (92.2 kg) 198 lb 3.2 oz (89.9 kg)    Telemetry    SR, Sinus brady, down to 30s while resting - Personally Reviewed  ECG    01/17 NSR lateral T wave changes old  - Personally Reviewed  Physical Exam   GEN: WD, WN, NAD   Neck: JVD  not elevated Cardiac: reg R&R, NO rub, no murmur Respiratory: CTA bilat GI: +BS, soft, NT  MS: No edema, pulses intact Neuro:  no focal deficits Psych: nl affect   Labs    Chemistry Recent Labs  Lab 02/19/17 0650 02/20/17 0309  NA 134* 138  K 3.9 4.3  CL 104 106  CO2 23 23  GLUCOSE 124* 102*  BUN 12 12  CREATININE 0.86 0.84  CALCIUM 9.2 8.9  PROT 7.5  --   ALBUMIN 4.1  --   AST 34  --   ALT 37  --   ALKPHOS 60  --   BILITOT 1.1  --   GFRNONAA >60 >60  GFRAA >60 >60  ANIONGAP 7 9     Hematology Recent Labs  Lab 02/19/17 0650 02/20/17 0309  WBC 15.0* 6.8  RBC 5.28 4.80  HGB 16.4 14.7  HCT 46.3 43.3  MCV 87.7 90.2  MCH 31.1 30.6  MCHC 35.4 33.9  RDW 13.5 13.7  PLT 280 261    Cardiac Enzymes Recent Labs  Lab 02/19/17 2004 02/20/17 0309 02/20/17 0857 02/20/17 1840  TROPONINI 9.73* 5.72* 8.26* 11.10*  Recent Labs  Lab 02/19/17 0656  TROPIPOC 2.01*     Radiology    Dg Chest 2 View  Result Date: 02/19/2017 CLINICAL DATA:  Chest pain 3 days with onset of shortness of breath today. Smoker. EXAM: CHEST  2 VIEW COMPARISON:  None. FINDINGS: Lungs are adequately inflated and otherwise clear. Cardiomediastinal silhouette, bones and soft tissues are within normal. IMPRESSION: No active cardiopulmonary disease. Electronically Signed   By: Elberta Fortisaniel  Boyle M.D.   On: 02/19/2017 08:25   Mr Cardiac Morphology W Wo Contrast  Result Date: 02/20/2017 CLINICAL DATA:  Myocarditis EXAM: CARDIAC MRI TECHNIQUE: The patient was scanned on a 1.5 Tesla GE magnet. A dedicated cardiac coil was used. Functional imaging was done using Fiesta sequences. 2,3, and 4 chamber views were done to assess for RWMA's. Modified Simpson's rule using a short axis stack was used to calculate an ejection fraction on a dedicated work Research officer, trade unionstation using Circle software. The patient received 30 cc of Multihance. After 10 minutes inversion recovery sequences were used to assess for infiltration and scar  tissue. CONTRAST:  30 cc Multihance FINDINGS: All 4 chambers were normal in size and function. There was no pericardial effusion. There was no ASD/VSD. The aortic root was normal The quantitative EF was 61% (EDV 150 cc ESV 59 cc SV 91 cc) The mitral, aortic and tricuspid valve were normal. Delayed enhancement images showed significant sub epicardial uptake in the distal anterior wall, apex and inferior apex IMPRESSION: 1) Normal LV size and function EF 61% 2) Gadolinium uptake subepicardial in distal anterior wall, apex and inferior apex consistent with myocarditis 3) Normal RV size and function 4) No pericardial effusion 5) Normal cardiac valves 6) Normal aortic root Charlton HawsPeter Nishan Electronically Signed   By: Charlton HawsPeter  Nishan M.D.   On: 02/20/2017 17:38    Cardiac Studies   Echo normal EF 50 - 55%  Cath no CAD   Patient Profile     22 y.o. male with chest pain. Echo EF 50-55% Cath with no CAD presumed myopericarditis  Assessment & Plan    1. Myocarditis: - dx confirmed by MRI  Cath normal   - no other infiltrative dz seen - pt has responded to rx, is asymptomatic  Continue on motrin Taper   WIll stay on colchicine 2. Sinus brady - asymptomatic - no rate-lowering rx     For questions or updates, please contact [facility] Please consult [email or web address] for contact info under Cardiology/STEMI.      Signed, Theodore Demarkhonda Barrett, PA-C  02/21/2017, 7:39 AM     Pt seen and examined  I agree with findings as noted by R Barrett above Doing good  No CP On exam:  Lungs:  CTA  Cardiac RRR  No S3  No rub  Ext without edema  Plan to continue meds asa noted above [place]  No exercise until then    Counselled on tobacco cessation.  Plan to d/c today   Maryclare BeanPaula Brentin Shin Doneen Ollinger

## 2017-02-21 NOTE — Progress Notes (Signed)
Pt 's HR went to 38, nonsustained last night while asleep, no s/s, will continue to monitor, Thanks Glenna FellowsMike F RN.

## 2017-02-21 NOTE — Progress Notes (Signed)
Patient declined wheelchair escort to his transportation home.  Discharged now and confirmed he has clothing, shoes, cell phone, and wallet with him and these are only belongings he brought to hospital.

## 2017-04-21 ENCOUNTER — Other Ambulatory Visit: Payer: Self-pay | Admitting: Cardiovascular Disease

## 2017-04-30 ENCOUNTER — Other Ambulatory Visit: Payer: Self-pay | Admitting: Cardiovascular Disease

## 2017-05-01 NOTE — Telephone Encounter (Signed)
Left message on voice mail after reviewing with Dr. Tenny Crawoss.  Provided 1 month of refill of cholchine.  Pt needs [place] in [place]ology[place].  Asked that he call back.

## 2017-05-01 NOTE — Telephone Encounter (Signed)
Pt has never been seen in the office, but Dr. Tenny Crawoss saw pt in the hospital and this medication was prescribed. Pt still does not have an appt yet. Would Dr. Tenny Crawoss like to refill this medication? Please address

## 2018-11-12 IMAGING — CR DG CHEST 2V
2 series · 2 of 2 positions shown · non-contrast
Comparison: None.

CLINICAL DATA: Chest pain 3 days with onset of shortness of breath
today. Smoker.

EXAM:
CHEST  2 VIEW

[w chest pa]
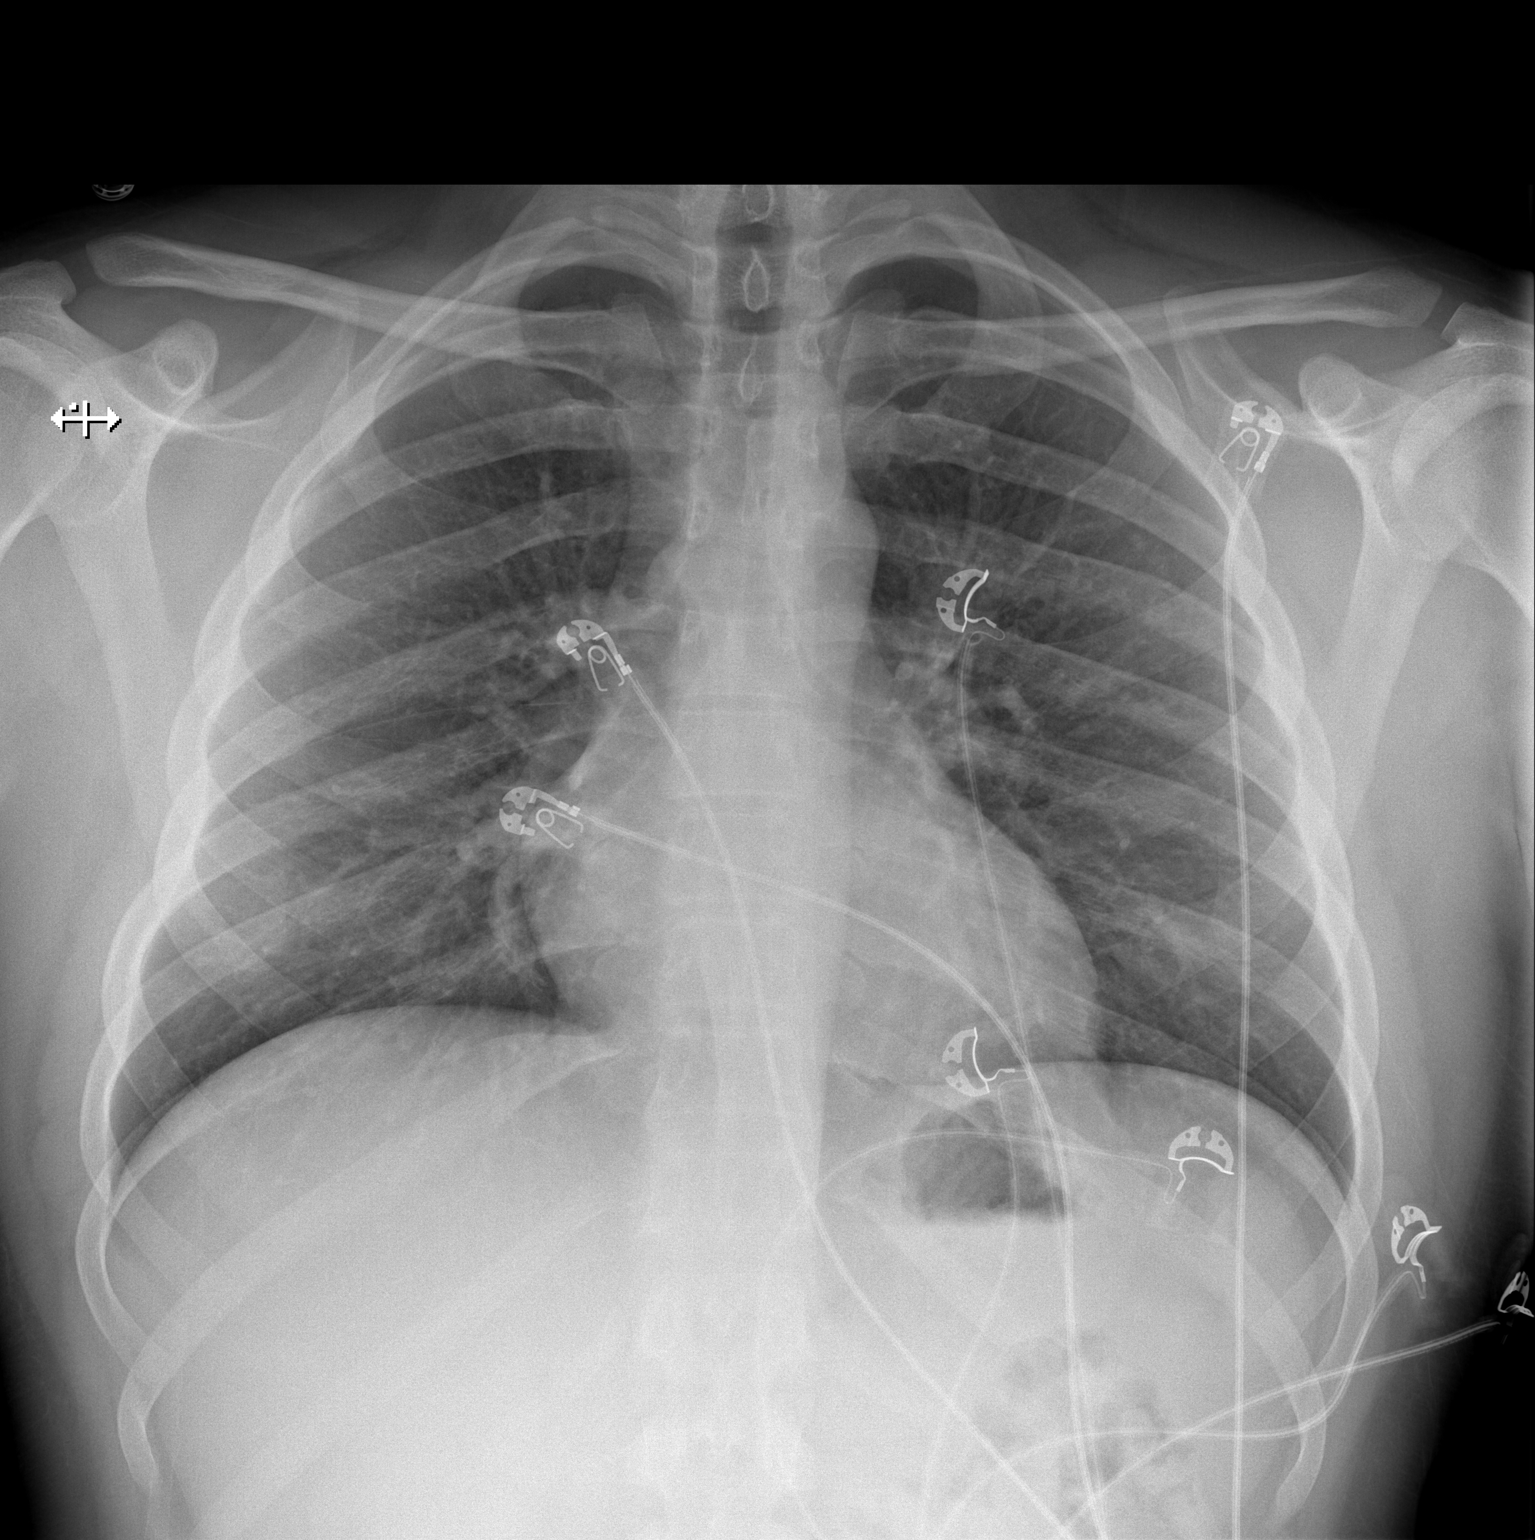

[w chest lat]
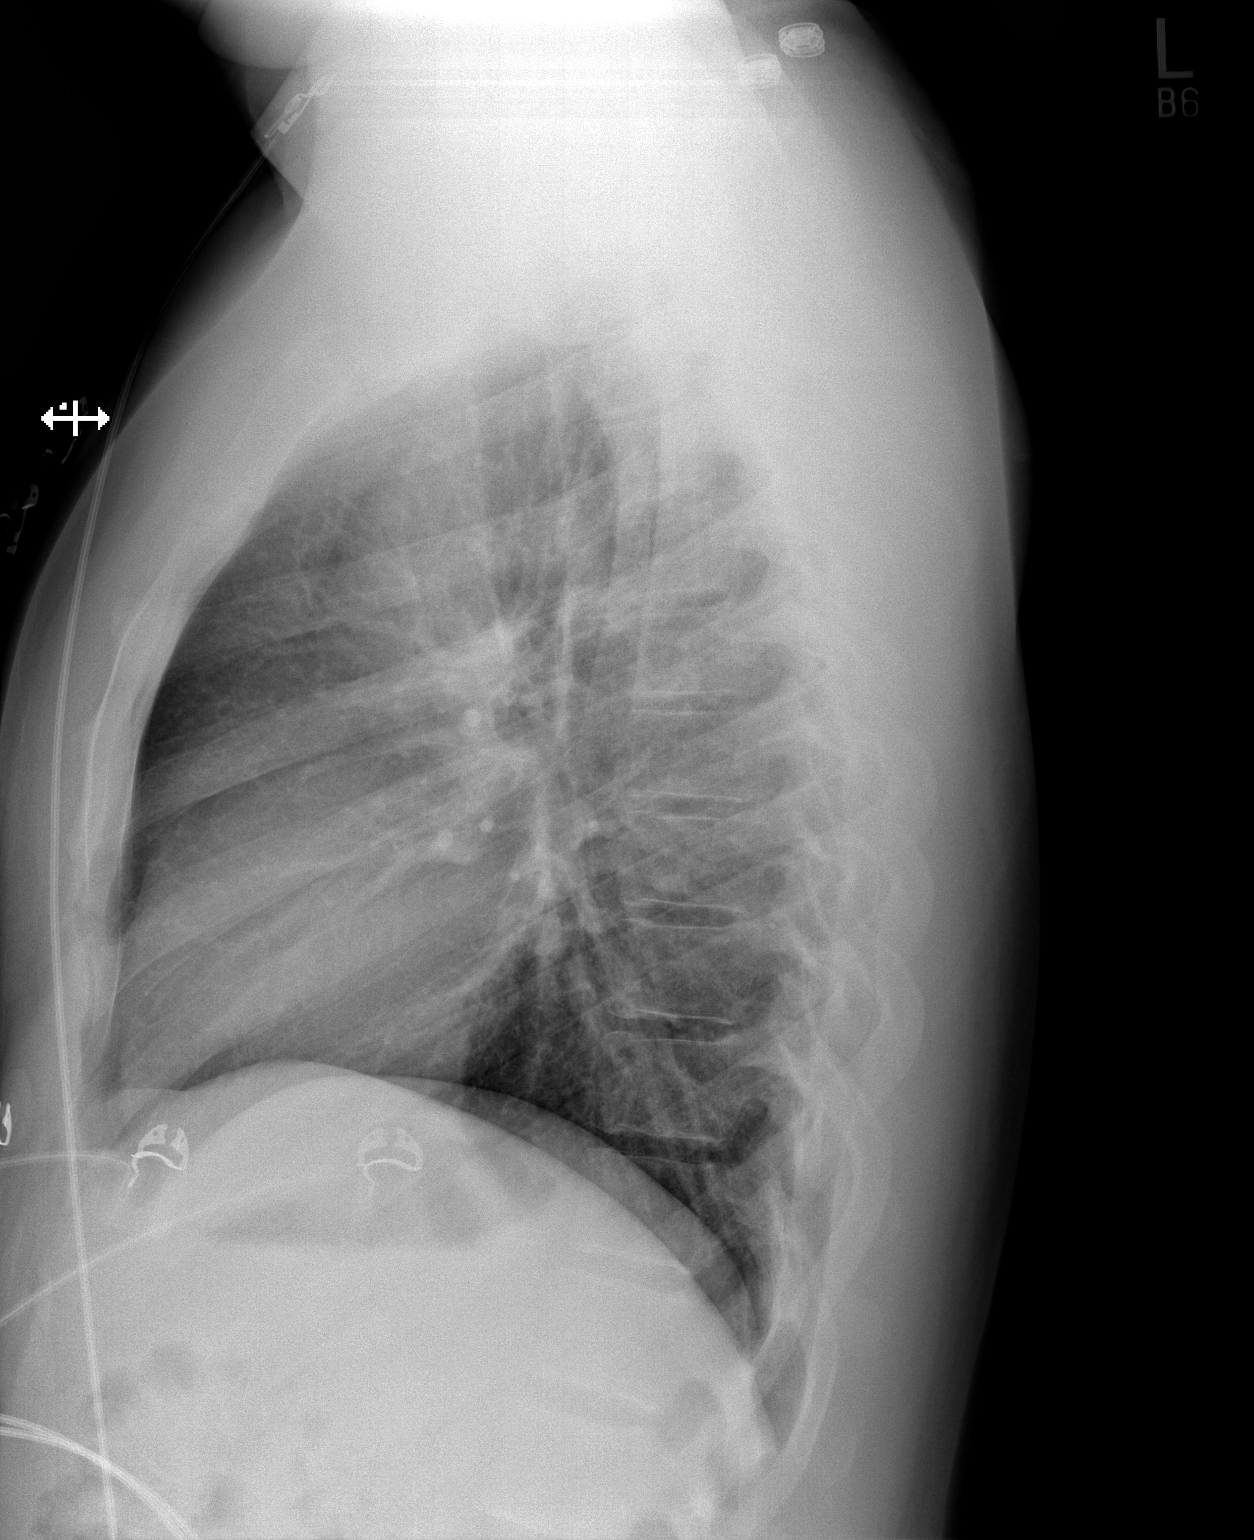

[2 of 2 positions shown; findings below may reference images not displayed]

FINDINGS: Lungs are adequately inflated and otherwise clear. Cardiomediastinal
silhouette, bones and soft tissues are within normal.
IMPRESSION: No active cardiopulmonary disease.

## 2018-11-13 IMAGING — MR MR CARD MORPHOLOGY WO/W CM
12 of 14 series · 37 of 40 positions shown · IV contrast (8     MH)
Comparison: none

CLINICAL DATA: Myocarditis

EXAM:
CARDIAC MRI
TECHNIQUE: The patient was scanned on a 1.5 Tesla GE magnet. A dedicated
cardiac coil was used. Functional imaging was done using Fiesta
sequences. [DATE], and 4 chamber views were done to assess for RWMA's.
Modified Ribery rule using a short axis stack was used to
calculate an ejection fraction on a dedicated work station using
[employer] software. The patient received 30 cc of Multihance. After 10
minutes inversion recovery sequences were used to assess for
infiltration and scar tissue.
CONTRAST:  30 cc Multihance

[Series 3: bSSFP · sagittal · 8.0mm · 1.41mm/px · 1 of 19 slices shown (1 of 4)]
[im 1/19]
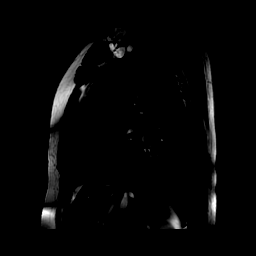

[Series 4: bSSFP · oblique · 8.0mm · 1.37mm/px · 1 of 20 slices shown (2 of 4)]
[im 1/20]
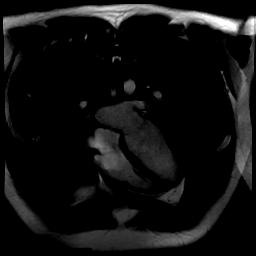

[Series 5: T1 · oblique · 8.0mm · 1.37mm/px · 1 of 14 slices shown]
[im 1/14]
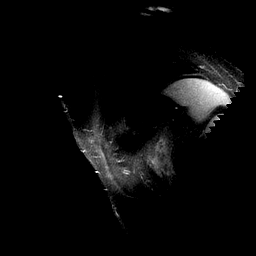

[Series 6: bSSFP · oblique · 8.0mm · 1.37mm/px · 20 of 320 slices shown (3 of 4)]
[im 1/320]
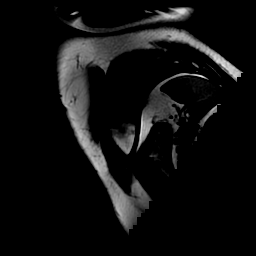
[im 17/320]
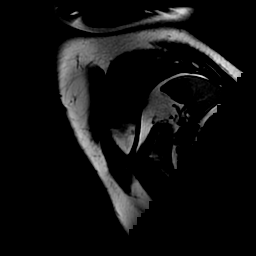
[im 34/320]
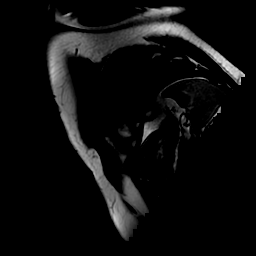
[im 51/320]
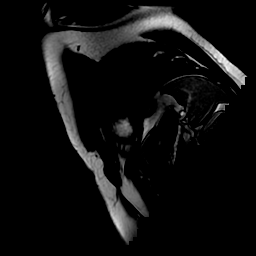
[im 68/320]
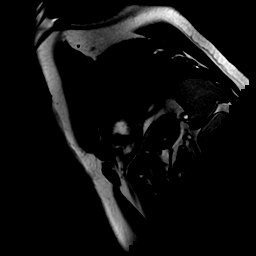
[im 84/320]
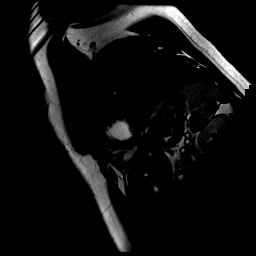
[im 101/320]
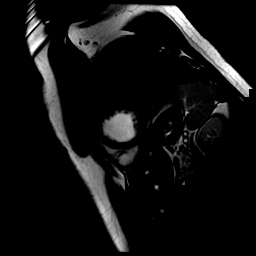
[im 118/320]
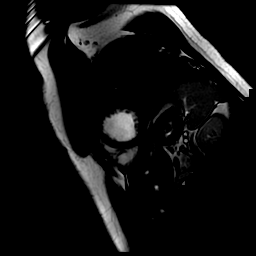
[im 135/320]
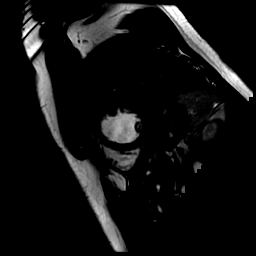
[im 152/320]
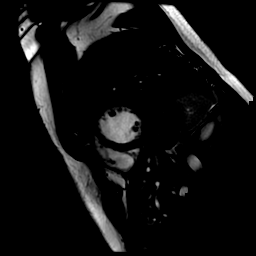
[im 168/320]
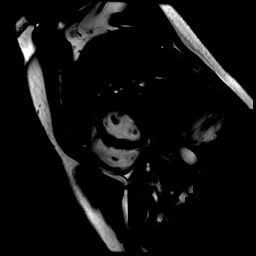
[im 185/320]
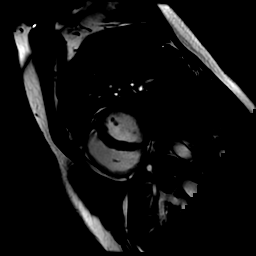
[im 202/320]
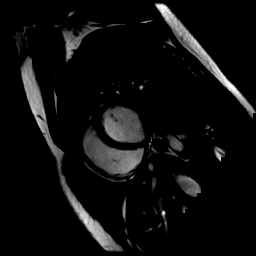
[im 219/320]
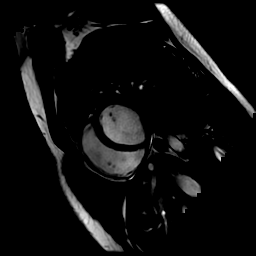
[im 236/320]
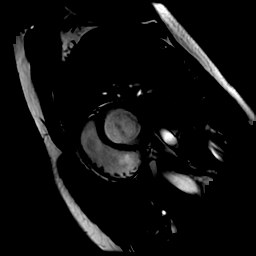
[im 252/320]
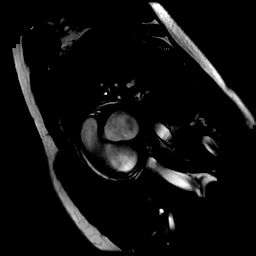
[im 269/320]
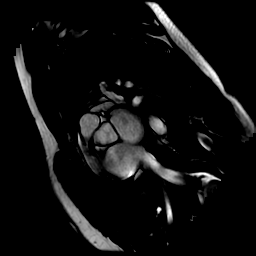
[im 286/320]
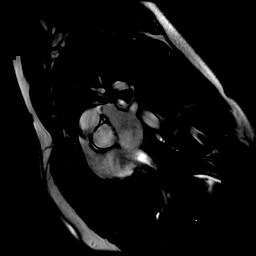
[im 303/320]
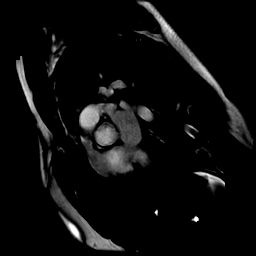
[im 320/320]
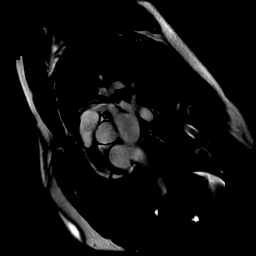

[Series 9: qpqs (id) · axial · 8.0mm · 1.48mm/px · z∈[+3,+3]mm · 3 of 60 slices shown]
[im 1/60]
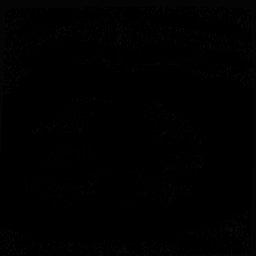
[im 30/60]
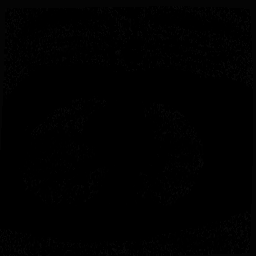
[im 60/60]
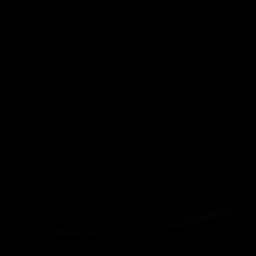

[Series 11: bSSFP · oblique · 8.0mm · 1.41mm/px · 4 of 60 slices shown (4 of 4)]
[im 1/60]
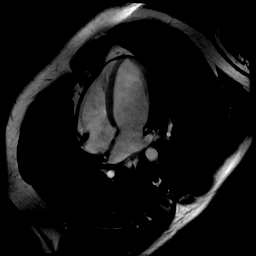
[im 20/60]
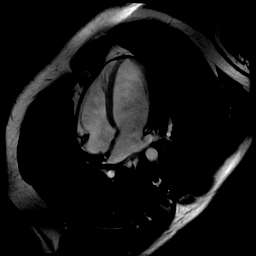
[im 40/60]
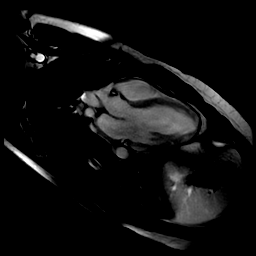
[im 60/60]
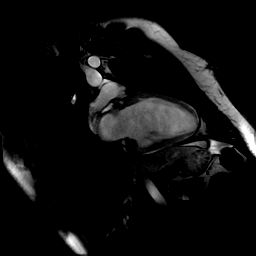

[Series 12: cine ir · oblique · 8.0mm · 1.37mm/px · 2 of 30 slices shown]
[im 1/30]
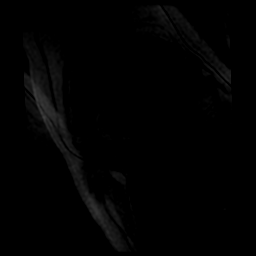
[im 30/30]
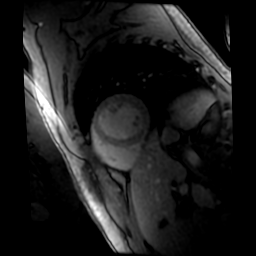

[Series 15: delayed ir prep · oblique · 8.0mm · 1.37mm/px · 1 of 8 slices shown (1 of 2)]
[im 1/8]
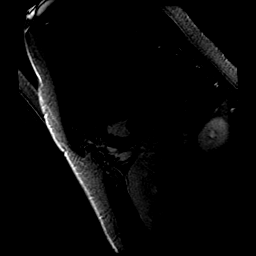

[Series 16: delayed ir prep · oblique · 8.0mm · 1.37mm/px · 1 of 9 slices shown (2 of 2)]
[im 1/9]
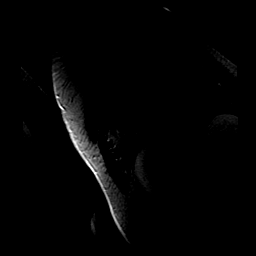

[Series 17: rad mde · oblique · 8.0mm · 1.41mm/px · 1 of 3 slices shown]
[im 1/3]
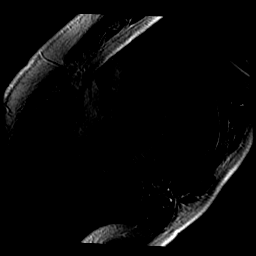

[Series 19: vla mde · oblique · 8.0mm · 1.48mm/px · 1 of 3 slices shown]
[im 1/3]
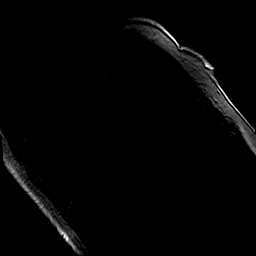

[Series 20: hla mde · oblique · 8.0mm · 1.48mm/px · 1 of 3 slices shown]
[im 1/3]
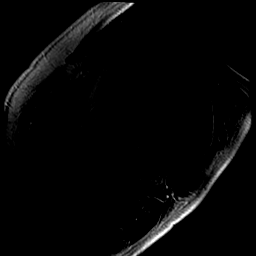

[37 of 40 positions shown; findings below may reference images not displayed]

FINDINGS: All 4 chambers were normal in size and function. There was no
pericardial effusion. There was no ASD/VSD. The aortic root was
normal The quantitative EF was 61% (EDV 150 cc ESV 59 cc SV 91 cc)

The mitral, aortic and tricuspid valve were normal. Delayed
enhancement images showed significant sub epicardial uptake in the
distal anterior wall, apex and inferior apex
IMPRESSION: 1) Normal LV size and function EF 61%

2) Gadolinium uptake subepicardial in distal anterior wall, apex and
inferior apex consistent with myocarditis

3) Normal RV size and function

4) No pericardial effusion

5) Normal cardiac valves

6) Normal aortic Chiko

Mohammadou Benjamin
